# Patient Record
Sex: Male | Born: 1980 | Race: White | Hispanic: No | Marital: Married | Smoking: Never smoker
Health system: Southern US, Community
[De-identification: ages and names within clinical notes are randomized; demographics above are authoritative.]

## PROBLEM LIST (undated history)

## (undated) HISTORY — PX: BACK SURGERY: SHX140

---

## 2005-05-07 ENCOUNTER — Emergency Department: Payer: Self-pay | Admitting: Unknown Physician Specialty

## 2010-04-22 ENCOUNTER — Encounter: Admission: RE | Admit: 2010-04-22 | Discharge: 2010-04-22 | Payer: Self-pay | Admitting: Occupational Medicine

## 2013-04-17 ENCOUNTER — Ambulatory Visit: Payer: Self-pay

## 2013-04-17 ENCOUNTER — Other Ambulatory Visit: Payer: Self-pay | Admitting: Occupational Medicine

## 2013-04-17 DIAGNOSIS — R52 Pain, unspecified: Secondary | ICD-10-CM

## 2013-08-23 ENCOUNTER — Other Ambulatory Visit: Payer: Self-pay | Admitting: *Deleted

## 2013-08-23 DIAGNOSIS — M545 Low back pain: Secondary | ICD-10-CM

## 2013-08-23 DIAGNOSIS — M79604 Pain in right leg: Secondary | ICD-10-CM

## 2013-08-28 ENCOUNTER — Other Ambulatory Visit: Payer: Self-pay | Admitting: *Deleted

## 2013-08-28 DIAGNOSIS — Z139 Encounter for screening, unspecified: Secondary | ICD-10-CM

## 2013-08-30 ENCOUNTER — Ambulatory Visit
Admission: RE | Admit: 2013-08-30 | Discharge: 2013-08-30 | Disposition: A | Discharge status: Home / Self Care | Payer: 59 | Source: Ambulatory Visit | Attending: *Deleted | Admitting: *Deleted

## 2013-08-30 DIAGNOSIS — Z139 Encounter for screening, unspecified: Secondary | ICD-10-CM

## 2013-08-30 DIAGNOSIS — M545 Low back pain: Secondary | ICD-10-CM

## 2013-08-30 DIAGNOSIS — M79604 Pain in right leg: Secondary | ICD-10-CM

## 2013-08-30 MED ORDER — GADOBENATE DIMEGLUMINE 529 MG/ML IV SOLN
17.0000 mL | Freq: Once | INTRAVENOUS | Status: AC | PRN
  Administered 2013-08-30: 17 mL via INTRAVENOUS

## 2014-09-07 IMAGING — CR DG ORBITS FOR FOREIGN BODY
2 series · 2 of 2 positions shown · non-contrast
Comparison: None.

CLINICAL DATA: Metal working/exposure; clearance prior to MRI

EXAM:
ORBITS FOR FOREIGN BODY - 2 VIEW

[view not recorded (1 of 2)]
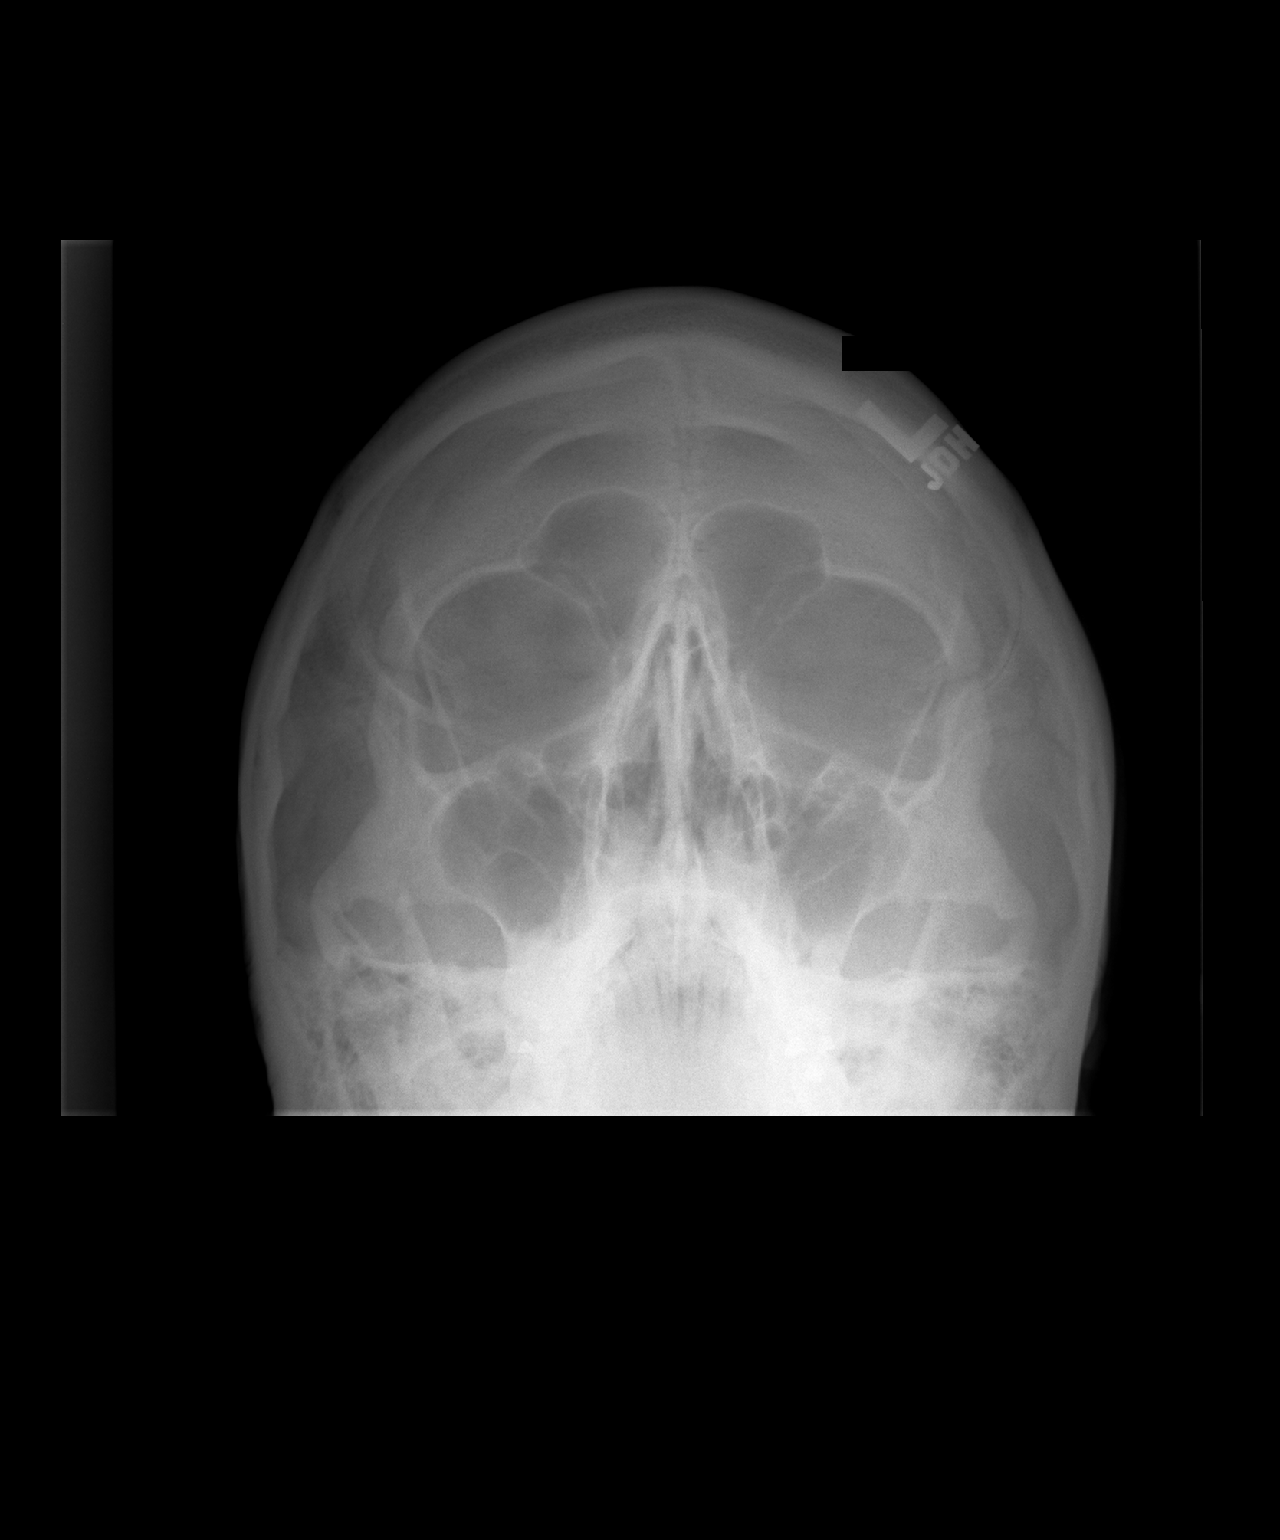

[view not recorded (2 of 2)]
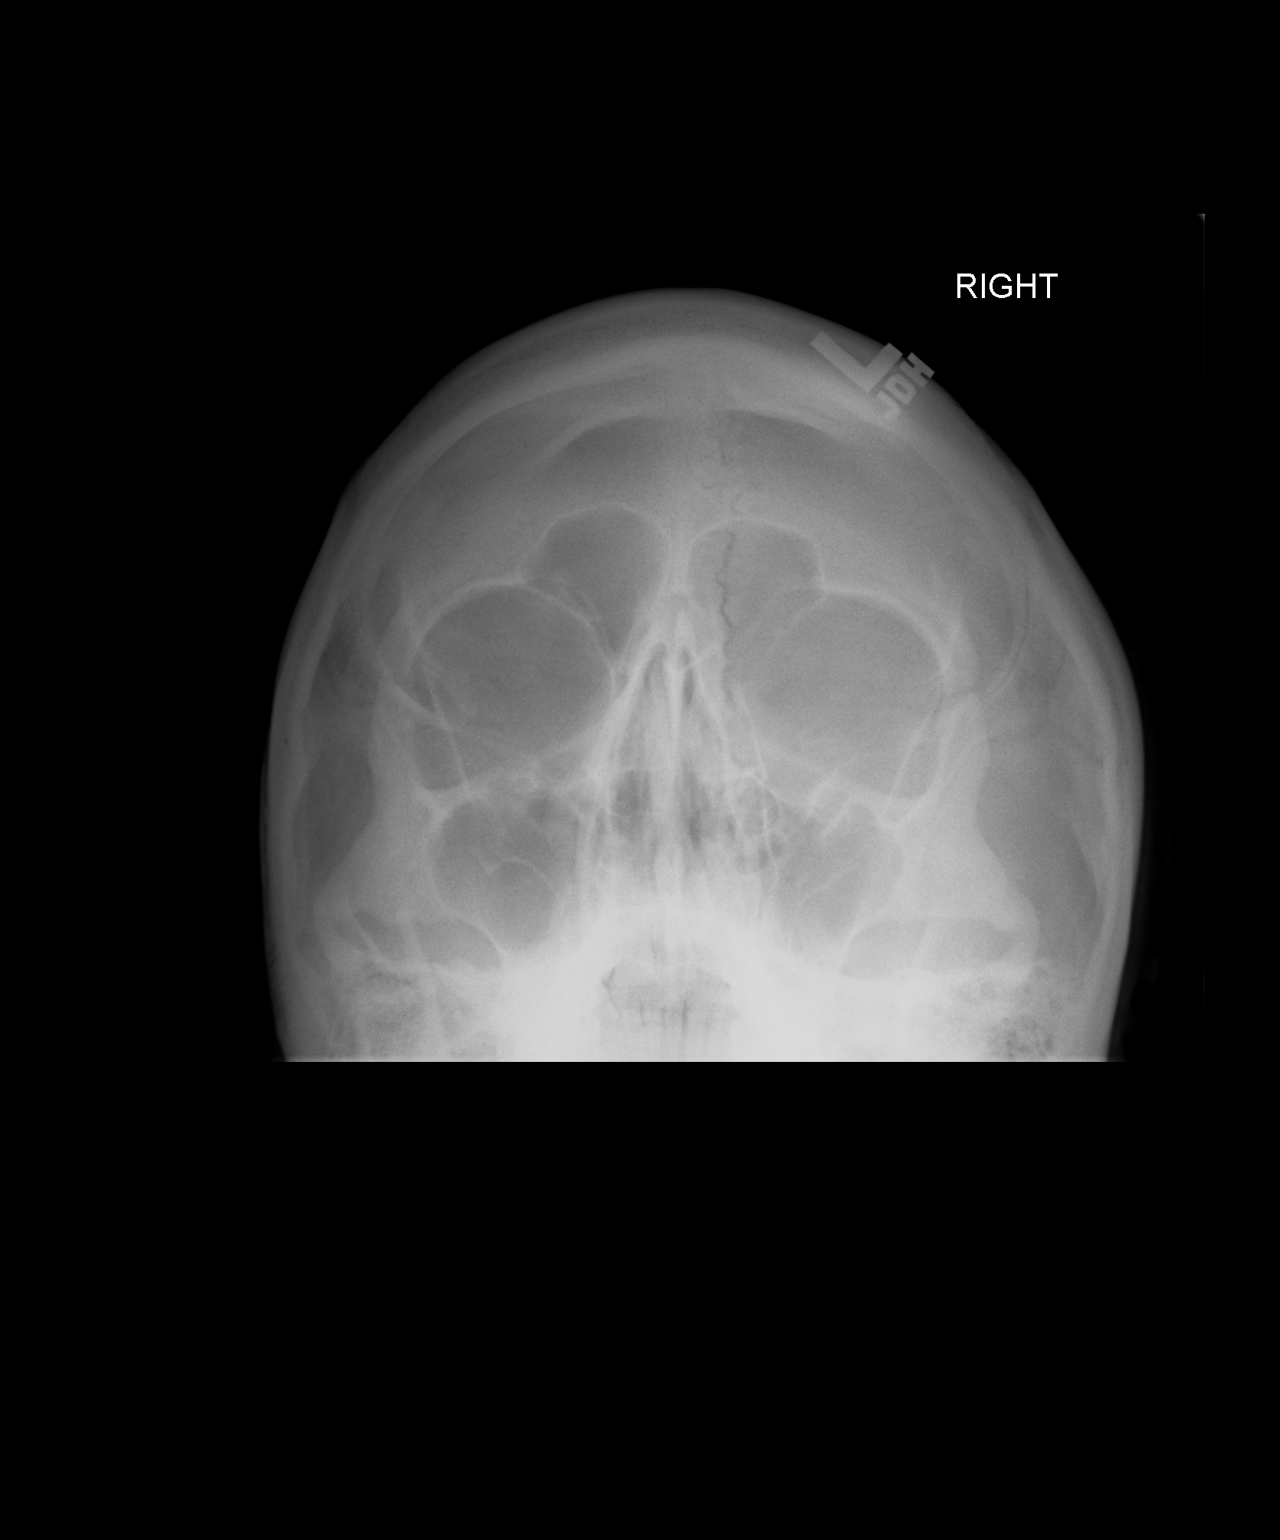

[2 of 2 positions shown; findings below may reference images not displayed]

FINDINGS: Water's views with eyes deviated toward the left and toward the
right were obtained. There is no evidence of metallic foreign body
within the orbits. There is no apparent fracture or dislocation.
Paranasal sinuses are clear.
IMPRESSION: No evidence of metallic foreign body within the orbits.

## 2015-01-02 ENCOUNTER — Emergency Department (HOSPITAL_COMMUNITY)
Admission: EM | Admit: 2015-01-02 | Discharge: 2015-01-02 | Discharge status: Absent without leave | Payer: Self-pay | Source: Home / Self Care

## 2015-06-10 ENCOUNTER — Emergency Department (HOSPITAL_COMMUNITY): Payer: Worker's Compensation

## 2015-06-10 ENCOUNTER — Encounter (HOSPITAL_COMMUNITY): Payer: Self-pay

## 2015-06-10 ENCOUNTER — Emergency Department (HOSPITAL_COMMUNITY)
Admission: EM | Admit: 2015-06-10 | Discharge: 2015-06-10 | Disposition: A | Discharge status: Home / Self Care | Payer: Worker's Compensation | Attending: Emergency Medicine | Admitting: Emergency Medicine

## 2015-06-10 DIAGNOSIS — Y939 Activity, unspecified: Secondary | ICD-10-CM | POA: Insufficient documentation

## 2015-06-10 DIAGNOSIS — X58XXXA Exposure to other specified factors, initial encounter: Secondary | ICD-10-CM | POA: Diagnosis not present

## 2015-06-10 DIAGNOSIS — S93402A Sprain of unspecified ligament of left ankle, initial encounter: Secondary | ICD-10-CM | POA: Diagnosis not present

## 2015-06-10 DIAGNOSIS — Y990 Civilian activity done for income or pay: Secondary | ICD-10-CM | POA: Insufficient documentation

## 2015-06-10 DIAGNOSIS — Y929 Unspecified place or not applicable: Secondary | ICD-10-CM | POA: Diagnosis not present

## 2015-06-10 DIAGNOSIS — S99912A Unspecified injury of left ankle, initial encounter: Secondary | ICD-10-CM | POA: Diagnosis present

## 2015-06-10 MED ORDER — IBUPROFEN 800 MG PO TABS: 800.0000 mg | ORAL_TABLET | Freq: Three times a day (TID) | ORAL | Status: AC

## 2015-06-10 NOTE — Discharge Instructions (Signed)
1. Medications: ibuprofen, usual home medications 2. Treatment: rest, drink plenty of fluids, use your ankle brace 3. Follow Up: Please followup with your primary doctor in 3-5 days for discussion of your diagnoses and further evaluation after today's visit; if you do not have a primary care doctor use the resource guide provided to find one; Please return to the ER for worsening symptoms     Ankle Sprain An ankle sprain is an injury to the strong, fibrous tissues (ligaments) that hold the bones of your ankle joint together.  CAUSES An ankle sprain is usually caused by a fall or by twisting your ankle. Ankle sprains most commonly occur when you step on the outer edge of your foot, and your ankle turns inward. People who participate in sports are more prone to these types of injuries.  SYMPTOMS   Pain in your ankle. The pain may be present at rest or only when you are trying to stand or walk.  Swelling.  Bruising. Bruising may develop immediately or within 1 to 2 days after your injury.  Difficulty standing or walking, particularly when turning corners or changing directions. DIAGNOSIS  Your caregiver will ask you details about your injury and perform a physical exam of your ankle to determine if you have an ankle sprain. During the physical exam, your caregiver will press on and apply pressure to specific areas of your foot and ankle. Your caregiver will try to move your ankle in certain ways. An X-ray exam may be done to be sure a bone was not broken or a ligament did not separate from one of the bones in your ankle (avulsion fracture).  TREATMENT  Certain types of braces can help stabilize your ankle. Your caregiver can make a recommendation for this. Your caregiver may recommend the use of medicine for pain. If your sprain is severe, your caregiver may refer you to a surgeon who helps to restore function to parts of your skeletal system (orthopedist) or a physical therapist. HOME CARE  INSTRUCTIONS   Apply ice to your injury for 1-2 days or as directed by your caregiver. Applying ice helps to reduce inflammation and pain.  Put ice in a plastic bag.  Place a towel between your skin and the bag.  Leave the ice on for 15-20 minutes at a time, every 2 hours while you are awake.  Only take over-the-counter or prescription medicines for pain, discomfort, or fever as directed by your caregiver.  Elevate your injured ankle above the level of your heart as much as possible for 2-3 days.  If your caregiver recommends crutches, use them as instructed. Gradually put weight on the affected ankle. Continue to use crutches or a cane until you can walk without feeling pain in your ankle.  If you have a plaster splint, wear the splint as directed by your caregiver. Do not rest it on anything harder than a pillow for the first 24 hours. Do not put weight on it. Do not get it wet. You may take it off to take a shower or bath.  You may have been given an elastic bandage to wear around your ankle to provide support. If the elastic bandage is too tight (you have numbness or tingling in your foot or your foot becomes cold and blue), adjust the bandage to make it comfortable.  If you have an air splint, you may blow more air into it or let air out to make it more comfortable. You may take your splint  off at night and before taking a shower or bath. Wiggle your toes in the splint several times per day to decrease swelling. SEEK MEDICAL CARE IF:   You have rapidly increasing bruising or swelling.  Your toes feel extremely cold or you lose feeling in your foot.  Your pain is not relieved with medicine. SEEK IMMEDIATE MEDICAL CARE IF:  Your toes are numb or blue.  You have severe pain that is increasing. MAKE SURE YOU:   Understand these instructions.  Will watch your condition.  Will get help right away if you are not doing well or get worse. Document Released: 11/16/2005 Document  Revised: 08/10/2012 Document Reviewed: 11/28/2011 [facility] Patient Information 2015 [place], [place]. This information is not intended to replace advice given to you by your health care provider. Make sure you discuss any questions you have with your health care provider.

## 2015-06-10 NOTE — ED Provider Notes (Signed)
CSN: [id number]     Arrival date & time 06/10/15  2110 History  This chart was scribed for Dierdre ForthHannah Ariell Gunnels, PA-C, working with Eber HongBrian Miller, MD by Elon SpannerGarrett Cook, ED Scribe. This patient was seen in room TR10C/TR10C and the patient's care was started at 9:21 PM.   Chief Complaint  Patient presents with  . Fall  . Ankle Pain   HPI  HPI Comments: Princella PellegriniJohn Chen is a 34 y.o. male who presents to the Emergency Department complaining of a left ankle injury sustained 45 minutes ago.  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  He heard "pops" at the time of onset.  He complains currently of throbbing, moderate left ankle pain.  He is able to ambulate and the pain has become worse with rest.  He denies foot numbness/tingling.  NKA.  History reviewed. No pertinent past medical history. Past Surgical History  Procedure Laterality Date  . Back surgery     History reviewed. No pertinent family history. History  Substance Use Topics  . Smoking status: Never Smoker   . Smokeless tobacco: Not on file  . Alcohol Use: No    Review of Systems  Constitutional: Negative for fever and chills.  Gastrointestinal: Negative for nausea and vomiting.  Musculoskeletal: Positive for joint swelling and arthralgias. Negative for back pain, neck pain and neck stiffness.  Skin: Negative for wound.  Neurological: Negative for weakness and numbness.  Hematological: Does not bruise/bleed easily.  Psychiatric/Behavioral: The patient is not nervous/anxious.   All other systems reviewed and are negative.     Allergies  Review of patient's allergies indicates not on file.  Home Medications   Prior to Admission medications   Medication Sig Start Date End Date Taking? Authorizing Provider  ibuprofen (ADVIL,MOTRIN) 800 MG tablet Take 1 tablet (800 mg total) by mouth 3 (three) times daily. 06/10/15   [name], PA-C   BP 146/76  mmHg  Pulse 94  Resp 22  Ht 6' (1.829 m)  Wt 180 lb (81.647 kg)  BMI 24.41 kg/m2  SpO2 99% Physical Exam  Constitutional: He appears well-developed and well-nourished. No distress.  HENT:  Head: Normocephalic and atraumatic.  Eyes: Conjunctivae are normal.  Neck: Normal range of motion.  Cardiovascular: Normal rate, regular rhythm and intact distal pulses.   Capillary refill < 3 sec  Pulmonary/Chest: Effort normal and breath sounds normal.  Musculoskeletal: He exhibits tenderness. He exhibits no edema.  Full ROM of left ankle, left knee and all toes of left foot.  No swelling or ecchymosis.  ttp along the lateral joint line.    Neurological: He is alert. Coordination normal.  Sensation intact to dull and sharp.  Strength 5/5 in lower extremity including dorsiflexion and plantarflexion.  Patient ambulates with steady gait.   Skin: Skin is warm and dry. He is not diaphoretic.  No tenting of the skin  Psychiatric: He has a normal mood and affect.  Nursing note and vitals reviewed.   ED Course  Procedures (including critical care time)  DIAGNOSTIC STUDIES: Oxygen Saturation is 99% on RA, normal by my interpretation.    COORDINATION OF CARE:  9:24 PM Will order imaging and ibuprofen.  Patient acknowledges and agrees with plan.    Labs Review Labs Reviewed - No data to display  Imaging Review Dg Ankle Complete Left  06/10/2015   CLINICAL DATA:  Left ankle pain after tripping on a fire  hose and bending ankle back. Twisting injury.  EXAM: LEFT ANKLE COMPLETE - 3+ VIEW  COMPARISON:  None.  FINDINGS: There is no evidence of fracture, dislocation, or joint effusion. There is no evidence of arthropathy or other focal bone abnormality. Soft tissues are unremarkable.  IMPRESSION: Negative.   Electronically Signed   By: Burman Nieves M.D.   On: 06/10/2015 21:52     EKG Interpretation None      MDM   Final diagnoses:  Left ankle sprain, initial encounter   Ronnie Chen  presents with left ankle sprain. Mild midline joint tenderness. Will obtain x-ray.  10:06 PM Patient X-Ray negative for obvious fracture or dislocation. Pain managed in ED. Pt advised to follow up with orthopedics if symptoms persist for possibility of missed fracture diagnosis. Patient given brace while in ED, conservative therapy recommended and discussed. Patient will be dc home & is agreeable with above plan.  BP 146/76 mmHg  Pulse 94  Resp 22  Ht 6' (1.829 m)  Wt 180 lb (81.647 kg)  BMI 24.41 kg/m2  SpO2 99%   I personally performed the services described in this documentation, which was scribed in my presence. The recorded information has been reviewed and is accurate.    [name], PA-C 06/10/15 2256  Eber Hong, MD 06/11/15 [phone]

## 2015-06-10 NOTE — ED Notes (Signed)
Pt states he tripped down some stairs. A fire hose wrapped around ankle and pulled him down the stairs.

## 2016-06-17 IMAGING — CR DG ANKLE COMPLETE 3+V*L*
3 series · 3 of 3 positions shown · non-contrast
Comparison: None.

CLINICAL DATA: Left ankle pain after tripping on a fire hose and
bending ankle back. Twisting injury.

EXAM:
LEFT ANKLE COMPLETE - 3+ VIEW

[ankle ap]
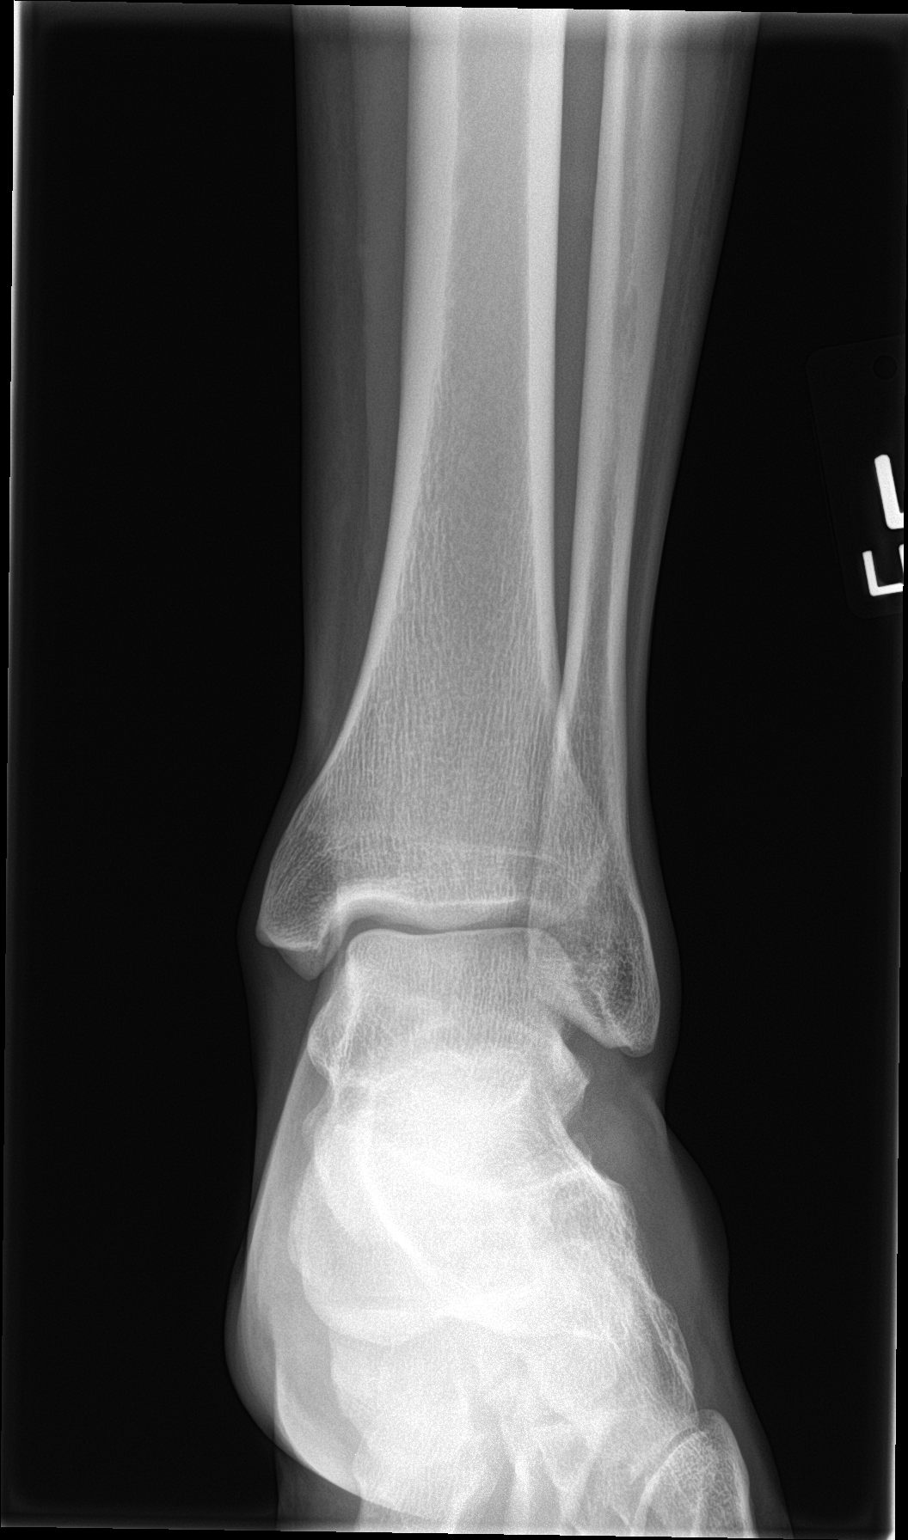

[ankle obl]
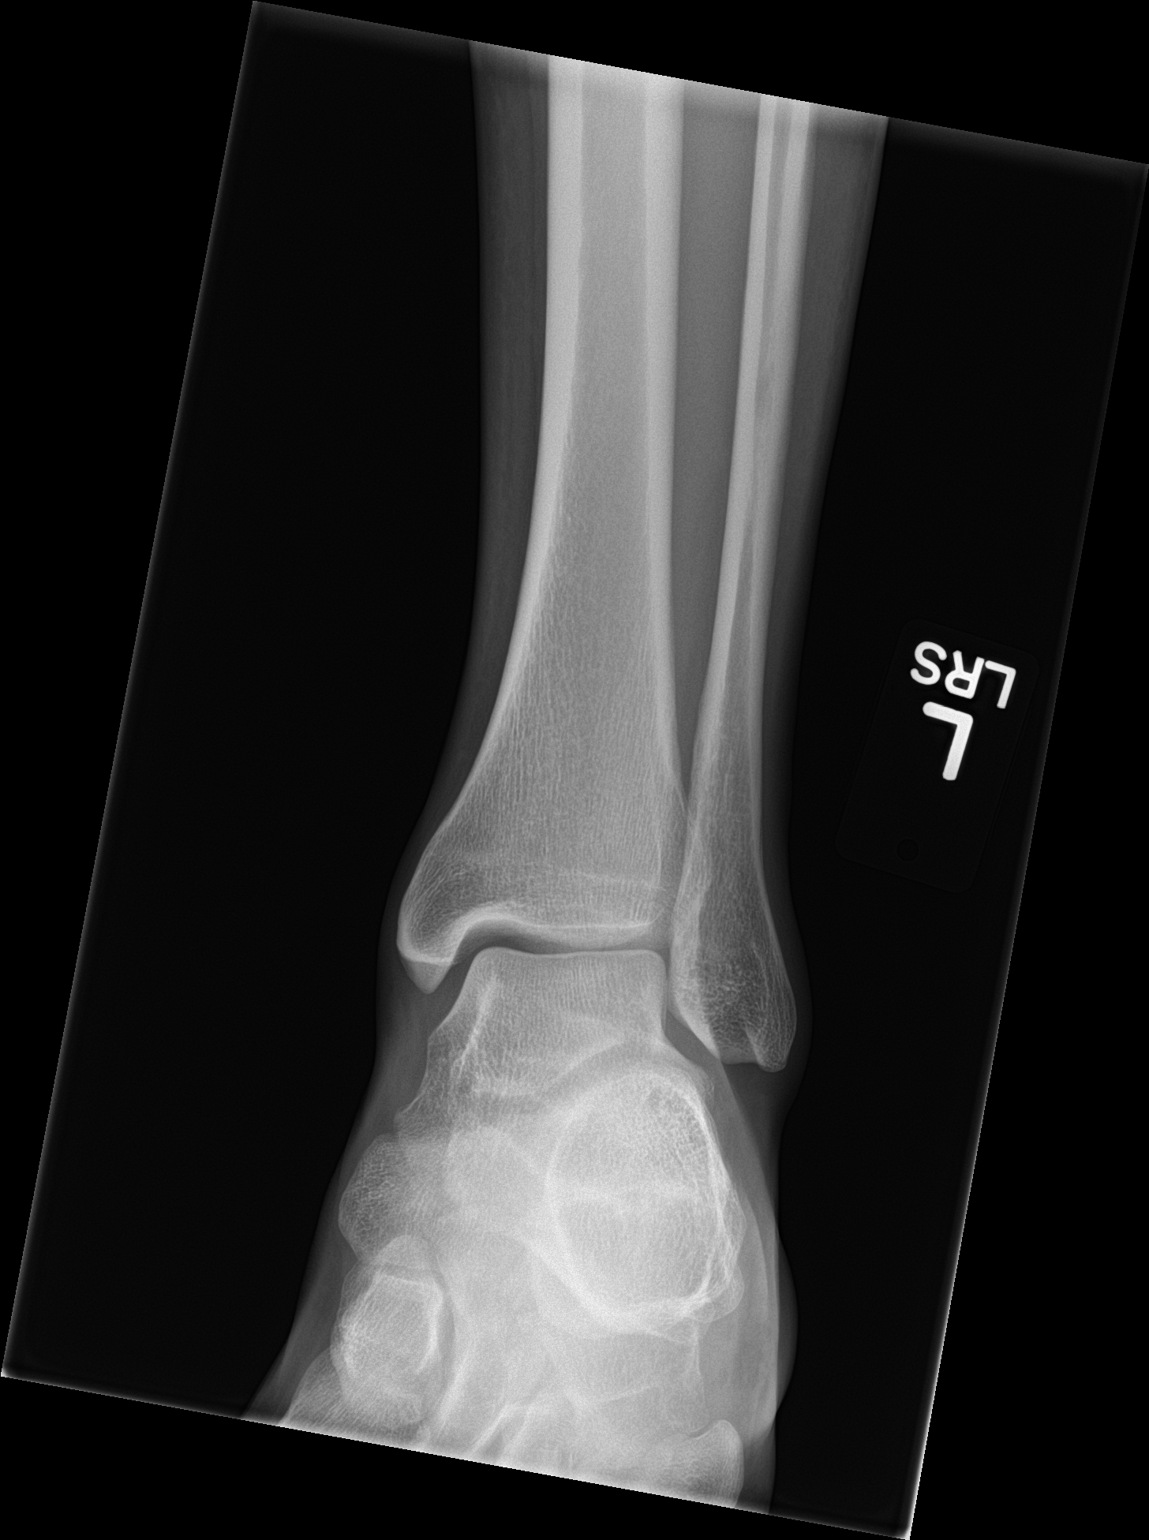

[ankle lat]
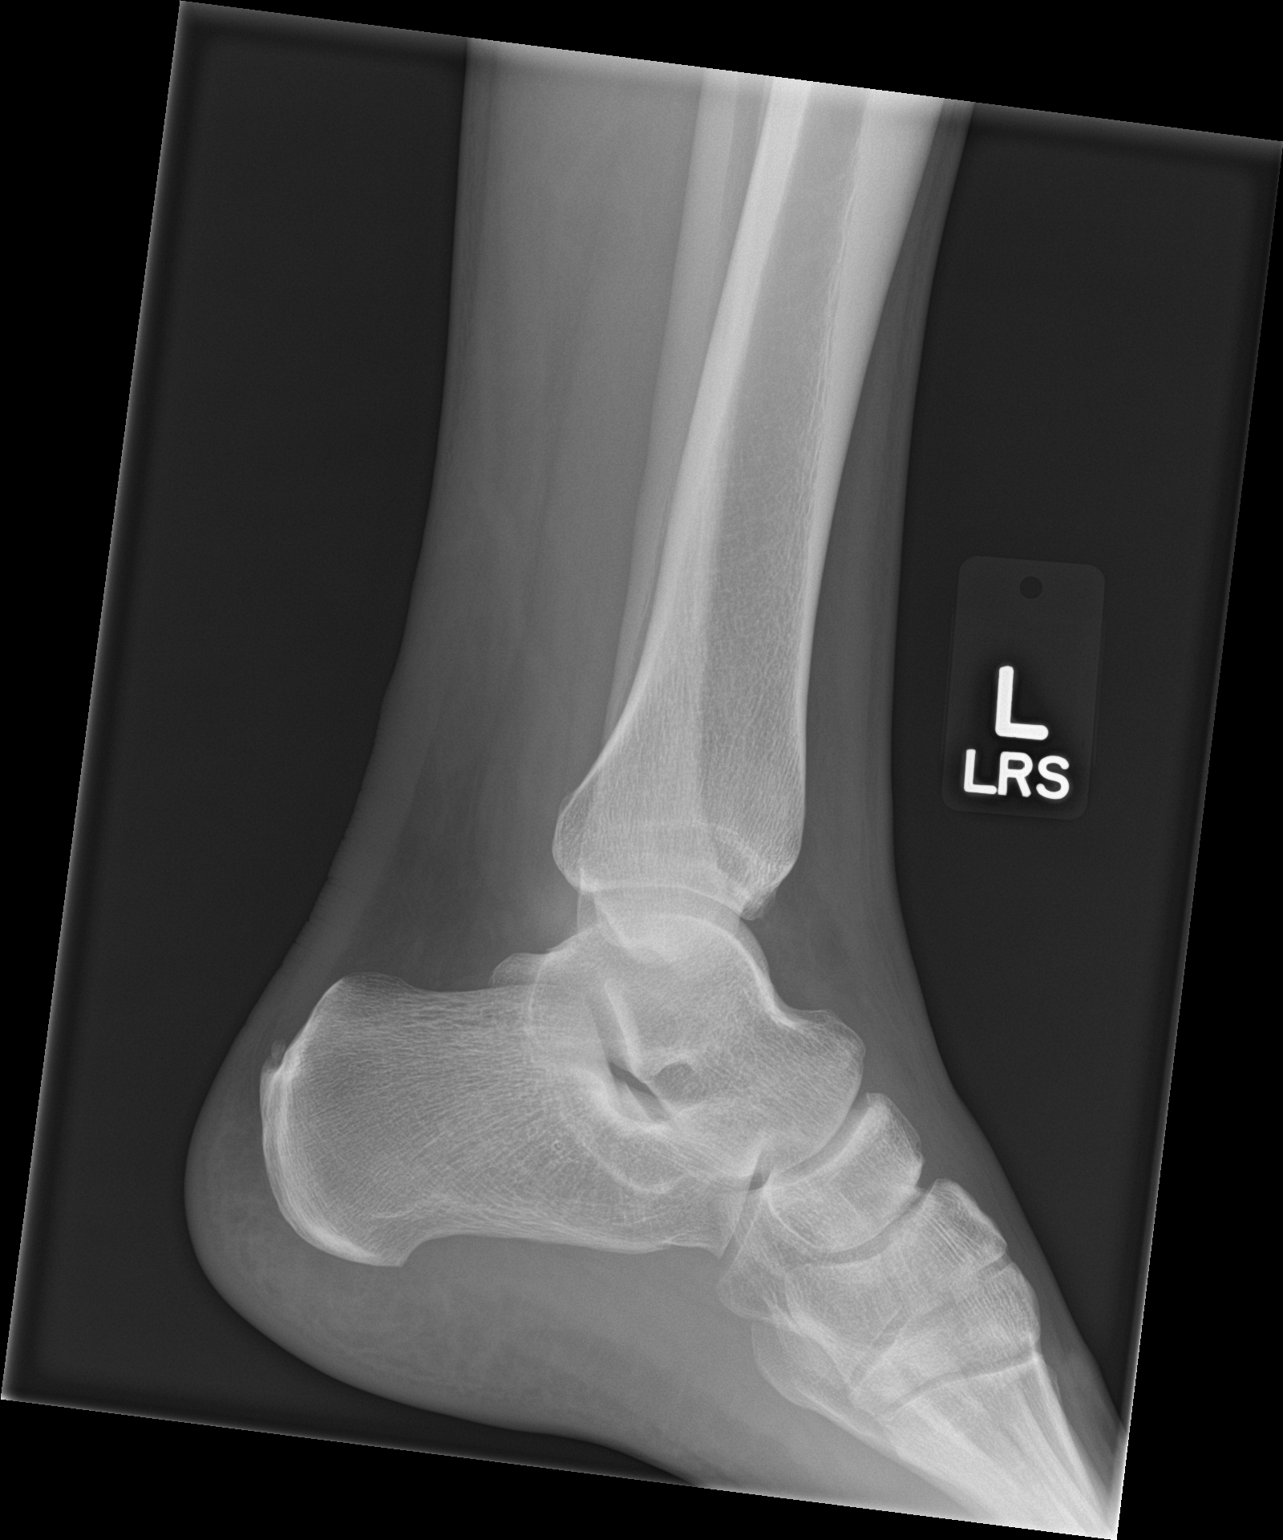

[3 of 3 positions shown; findings below may reference images not displayed]

FINDINGS: There is no evidence of fracture, dislocation, or joint effusion.
There is no evidence of arthropathy or other focal bone abnormality.
Soft tissues are unremarkable.
IMPRESSION: Negative.

## 2017-08-09 ENCOUNTER — Emergency Department: Payer: 59

## 2017-08-09 ENCOUNTER — Emergency Department
Admission: EM | Admit: 2017-08-09 | Discharge: 2017-08-09 | Disposition: A | Discharge status: Home / Self Care | Payer: 59 | Attending: Emergency Medicine | Admitting: Emergency Medicine

## 2017-08-09 DIAGNOSIS — Y93G1 Activity, food preparation and clean up: Secondary | ICD-10-CM | POA: Insufficient documentation

## 2017-08-09 DIAGNOSIS — Z79899 Other long term (current) drug therapy: Secondary | ICD-10-CM | POA: Insufficient documentation

## 2017-08-09 DIAGNOSIS — Y929 Unspecified place or not applicable: Secondary | ICD-10-CM | POA: Diagnosis not present

## 2017-08-09 DIAGNOSIS — W260XXA Contact with knife, initial encounter: Secondary | ICD-10-CM | POA: Insufficient documentation

## 2017-08-09 DIAGNOSIS — S61211A Laceration without foreign body of left index finger without damage to nail, initial encounter: Secondary | ICD-10-CM | POA: Insufficient documentation

## 2017-08-09 DIAGNOSIS — S61112A Laceration without foreign body of left thumb with damage to nail, initial encounter: Secondary | ICD-10-CM | POA: Insufficient documentation

## 2017-08-09 DIAGNOSIS — Y999 Unspecified external cause status: Secondary | ICD-10-CM | POA: Diagnosis not present

## 2017-08-09 MED ORDER — LIDOCAINE HCL (PF) 1 % IJ SOLN
INTRAMUSCULAR | Status: AC
  Administered 2017-08-09: 5 mL
  Filled 2017-08-09: qty 10

## 2017-08-09 MED ORDER — CEPHALEXIN 500 MG PO CAPS: 500.0000 mg | ORAL_CAPSULE | Freq: Four times a day (QID) | ORAL | 0 refills | Status: AC

## 2017-08-09 MED ORDER — LIDOCAINE HCL 1 % IJ SOLN
5.0000 mL | Freq: Once | INTRAMUSCULAR | Status: AC
  Administered 2017-08-09: 5 mL
  Filled 2017-08-09: qty 5

## 2017-08-09 MED ORDER — OXYCODONE-ACETAMINOPHEN 5-325 MG PO TABS
1.0000 | ORAL_TABLET | Freq: Once | ORAL | Status: AC
  Administered 2017-08-09: 1 via ORAL
  Filled 2017-08-09 (×2): qty 1

## 2017-08-09 MED ORDER — OXYCODONE-ACETAMINOPHEN 5-325 MG PO TABS: 1.0000 | ORAL_TABLET | Freq: Three times a day (TID) | ORAL | 0 refills | Status: AC | PRN

## 2017-08-09 NOTE — ED Notes (Signed)
FN: [facility] two lacerations to left hand /first and second digit.

## 2017-08-09 NOTE — ED Triage Notes (Signed)
Pt accidentally cut his left thumb and 2nd finger with a utility knife today. Bandage in place, bleeding controlled..Marland Kitchen

## 2017-08-09 NOTE — ED Provider Notes (Signed)
[facility] Emergency Department Provider Note  ____________________________________________  Time seen: Approximately 4:35 PM  I have reviewed the triage vital signs and the nursing notes.   HISTORY  Chief Complaint Laceration    HPI Ronnie Chen is a 36 y.o. male presenting to the emergency department with lacerations of the left first and second digits. Patient states that he was manipulating a knife trying to remove a lid when it slipped patient denies radiculopathy, weakness or changes in sensation of the left upper extremity. Tetanus status is up-to-date.   History reviewed. No pertinent past medical history.  There are no active problems to display for this patient.   Past Surgical History:  Procedure Laterality Date  . BACK SURGERY      Prior to Admission medications   Medication Sig Start Date End Date Taking? Authorizing Provider  cephALEXin (KEFLEX) 500 MG capsule Take 1 capsule (500 mg total) by mouth 4 (four) times daily. 08/09/17 08/19/17  Orvil Feil, PA-C  ibuprofen (ADVIL,MOTRIN) 800 MG tablet Take 1 tablet (800 mg total) by mouth 3 (three) times daily. 06/10/15   Muthersbaugh, Dahlia Client, PA-C  oxyCODONE-acetaminophen (ROXICET) 5-325 MG tablet Take 1 tablet by mouth every 8 (eight) hours as needed for severe pain. 08/09/17 08/12/17  Orvil Feil, PA-C    Allergies Patient has no known allergies.  No family history on file.  Social History Social History  Substance Use Topics  . Smoking status: Never Smoker  . Smokeless tobacco: Never Used  . Alcohol use No     Review of Systems  Constitutional: No fever/chills Eyes: No visual changes. No discharge ENT: No upper respiratory complaints. Cardiovascular: no chest pain. Respiratory: no cough. No SOB. Musculoskeletal: Negative for musculoskeletal pain. Skin: Patient has laceration of left first and second digits.  Neurological: Negative for headaches, focal weakness or  numbness.   ____________________________________________   PHYSICAL EXAM:  VITAL SIGNS: ED Triage Vitals  Enc Vitals Group     BP 08/09/17 1608 138/74     Pulse Rate 08/09/17 1608 61     Resp 08/09/17 1608 16     Temp 08/09/17 1608 97.9 F (36.6 C)     Temp Source 08/09/17 1608 Oral     SpO2 08/09/17 1608 100 %     Weight 08/09/17 1609 190 lb (86.2 kg)     Height 08/09/17 1609 6' (1.829 m)     Head Circumference --      Peak Flow --      Pain Score 08/09/17 1612 8     Pain Loc --      Pain Edu? --      Excl. in GC? --      Constitutional: Alert and oriented. Well appearing and in no acute distress. Eyes: Conjunctivae are normal. PERRL. EOMI. Head: Atraumatic. Cardiovascular: Normal rate, regular rhythm. Normal S1 and S2.  Good peripheral circulation. Respiratory: Normal respiratory effort without tachypnea or retractions. Lungs CTAB. Good air entry to the bases with no decreased or absent breath sounds. Musculoskeletal: Full range of motion to all extremities. No gross deformities appreciated. Neurologic:  Normal speech and language. No gross focal neurologic deficits are appreciated.  Skin: Patient has left first and second digit lacerations.  Psychiatric: Mood and affect are normal. Speech and behavior are normal. Patient exhibits appropriate insight and judgement.   ____________________________________________   LABS (all labs ordered are listed, but only abnormal results are displayed)  Labs Reviewed - No data to display ____________________________________________  EKG   ____________________________________________  RADIOLOGY Geraldo Pitter, personally viewed and evaluated these images (plain radiographs) as part of my medical decision making, as well as reviewing the written report by the radiologist.    Dg Hand Complete Left  Result Date: 08/09/2017 CLINICAL DATA:  Laceration to thumb and index finger with the utility night today, bleeding  controlled, bandage in place EXAM: LEFT HAND - COMPLETE 3+ VIEW COMPARISON:  None FINDINGS: Osseous mineralization normal. Joint spaces preserved. No acute fracture, dislocation, or bone destruction. No radiopaque foreign bodies. IMPRESSION: Normal exam. Electronically Signed   By: Ulyses Southward M.D.   On: 08/09/2017 17:06    ____________________________________________    PROCEDURES  Procedure(s) performed:    Procedures  LACERATION REPAIR Performed by: Orvil Feil Authorized by: Orvil Feil Consent: Verbal consent obtained. Risks and benefits: risks, benefits and alternatives were discussed Consent given by: patient Patient identity confirmed: provided demographic data Prepped and Draped in normal sterile fashion Wound explored  Laceration Location: Left index finger  Laceration Length: 3 cm  No Foreign Bodies seen or palpated  Anesthesia: local infiltration  Local anesthetic: lidocaine 1% without epinephrine  Anesthetic total: 5 ml  Irrigation method: syringe Amount of cleaning: standard  Skin closure: 4-0 Ethilon   Number of sutures: 7  Technique: Simple Interrupted  LACERATION REPAIR Performed by: Orvil Feil Authorized by: Orvil Feil Consent: Verbal consent obtained. Risks and benefits: risks, benefits and alternatives were discussed Consent given by: patient Patient identity confirmed: provided demographic data Prepped and Draped in normal sterile fashion Wound explored  Laceration Location: Left first digit  Laceration Length: 1 cm  No Foreign Bodies seen or palpated  Anesthesia: local infiltration  Local anesthetic: lidocaine 1% without epinephrine  Anesthetic total: 3 ml  Irrigation method: syringe Amount of cleaning: standard  Skin closure: 4-0 Ethilon   Number of sutures: 3  Technique: Simple Interrupted.   Patient tolerance: Patient tolerated the procedure well with no immediate complications.   Patient tolerance:  Patient tolerated the procedure well with no immediate complications.   Medications  lidocaine (XYLOCAINE) 1 % (with pres) injection 5 mL (5 mLs Infiltration Given 08/09/17 1709)  lidocaine (XYLOCAINE) 1 % (with pres) injection 5 mL (5 mLs Infiltration Given 08/09/17 1709)  oxyCODONE-acetaminophen (PERCOCET/ROXICET) 5-325 MG per tablet 1 tablet (1 tablet Oral Given 08/09/17 1709)     ____________________________________________   INITIAL IMPRESSION / ASSESSMENT AND PLAN / ED COURSE  Pertinent labs & imaging results that were available during my care of the patient were reviewed by me and considered in my medical decision making (see chart for details).  Review of the [place] CSRS was performed in accordance of the [facility] prior to dispensing any controlled drugs.    Assessment and Plan:  Left Index Finger Laceration Left first digit laceration  Patient presents to the emergency department with lacerations sustained to the left and second digits. Patient underwent laceration repair without complication. Patient was advised to have sutures removed by primary care in 9 days. Patient was discharged with Keflex. X-ray examination revealed no retained foreign bodies.     ____________________________________________  FINAL CLINICAL IMPRESSION(S) / ED DIAGNOSES  Final diagnoses:  Laceration of left index finger without foreign body without damage to nail, initial encounter      NEW MEDICATIONS STARTED DURING THIS VISIT:  Discharge Medication List as of 08/09/2017  5:51 PM    START taking these medications   Details  cephALEXin (KEFLEX) 500 MG  capsule Take 1 capsule (500 mg total) by mouth 4 (four) times daily., Starting Mon 08/09/2017, Until Thu 08/19/2017, Print            This chart was dictated using voice recognition software/Dragon. Despite best efforts to proofread, errors can occur which can change the meaning. Any change was purely unintentional.    Orvil FeilWoods, Margeaux Swantek M,  PA-C 08/09/17 Maureen [id number]    Dionne BucySiadecki, Sebastian, MD 08/09/17 2255

## 2024-12-28 ENCOUNTER — Other Ambulatory Visit (HOSPITAL_BASED_OUTPATIENT_CLINIC_OR_DEPARTMENT_OTHER): Payer: Self-pay | Admitting: Family Medicine

## 2024-12-28 DIAGNOSIS — Z8249 Family history of ischemic heart disease and other diseases of the circulatory system: Secondary | ICD-10-CM

## 2025-01-10 ENCOUNTER — Other Ambulatory Visit (HOSPITAL_BASED_OUTPATIENT_CLINIC_OR_DEPARTMENT_OTHER)
